# Patient Record
Sex: Female | Born: 1955
Health system: Southern US, Community
[De-identification: ages and names within clinical notes are randomized; demographics above are authoritative.]

## PROBLEM LIST (undated history)

## (undated) DIAGNOSIS — J45909 Unspecified asthma, uncomplicated: Secondary | ICD-10-CM

---

## 1994-08-31 HISTORY — PX: BREAST BIOPSY: SHX20

## 1994-08-31 HISTORY — PX: BREAST EXCISIONAL BIOPSY: SUR124

## 2003-09-01 HISTORY — PX: BREAST BIOPSY: SHX20

## 2004-07-16 ENCOUNTER — Ambulatory Visit: Payer: Self-pay | Admitting: General Surgery

## 2005-01-30 ENCOUNTER — Ambulatory Visit: Payer: Self-pay | Admitting: Unknown Physician Specialty

## 2005-02-04 ENCOUNTER — Ambulatory Visit: Payer: Self-pay | Admitting: General Surgery

## 2006-01-29 ENCOUNTER — Ambulatory Visit: Payer: Self-pay | Admitting: General Surgery

## 2006-06-23 ENCOUNTER — Emergency Department: Payer: Self-pay | Admitting: Emergency Medicine

## 2007-02-09 ENCOUNTER — Ambulatory Visit: Payer: Self-pay | Admitting: Obstetrics and Gynecology

## 2007-08-25 ENCOUNTER — Inpatient Hospital Stay: Payer: Self-pay | Admitting: Unknown Physician Specialty

## 2007-08-25 ENCOUNTER — Other Ambulatory Visit: Payer: Self-pay

## 2008-02-14 ENCOUNTER — Ambulatory Visit: Payer: Self-pay | Admitting: Obstetrics and Gynecology

## 2009-02-14 ENCOUNTER — Ambulatory Visit: Payer: Self-pay | Admitting: Obstetrics and Gynecology

## 2010-03-13 ENCOUNTER — Ambulatory Visit: Payer: Self-pay | Admitting: Obstetrics and Gynecology

## 2010-07-01 ENCOUNTER — Ambulatory Visit: Payer: Self-pay | Admitting: Gynecologic Oncology

## 2010-07-08 LAB — PATHOLOGY REPORT

## 2010-07-31 ENCOUNTER — Ambulatory Visit: Payer: Self-pay | Admitting: Gynecologic Oncology

## 2010-08-31 ENCOUNTER — Ambulatory Visit: Payer: Self-pay | Admitting: Gynecologic Oncology

## 2010-11-04 ENCOUNTER — Ambulatory Visit: Payer: Self-pay | Admitting: Gynecologic Oncology

## 2010-11-30 ENCOUNTER — Ambulatory Visit: Payer: Self-pay | Admitting: Gynecologic Oncology

## 2011-03-10 ENCOUNTER — Ambulatory Visit: Payer: Self-pay | Admitting: Gynecologic Oncology

## 2011-04-01 ENCOUNTER — Ambulatory Visit: Payer: Self-pay | Admitting: Gynecologic Oncology

## 2011-05-19 ENCOUNTER — Ambulatory Visit: Payer: Self-pay | Admitting: Obstetrics and Gynecology

## 2011-07-14 ENCOUNTER — Ambulatory Visit: Payer: Self-pay | Admitting: Gynecologic Oncology

## 2011-08-01 ENCOUNTER — Ambulatory Visit: Payer: Self-pay | Admitting: Gynecologic Oncology

## 2011-09-10 ENCOUNTER — Ambulatory Visit: Payer: Self-pay | Admitting: Unknown Physician Specialty

## 2011-09-18 ENCOUNTER — Ambulatory Visit: Payer: Self-pay | Admitting: Unknown Physician Specialty

## 2011-09-21 LAB — PATHOLOGY REPORT

## 2011-11-17 ENCOUNTER — Ambulatory Visit: Payer: Self-pay | Admitting: Gynecologic Oncology

## 2011-11-30 ENCOUNTER — Ambulatory Visit: Payer: Self-pay | Admitting: Gynecologic Oncology

## 2012-03-15 ENCOUNTER — Ambulatory Visit: Payer: Self-pay | Admitting: Gynecologic Oncology

## 2012-03-31 ENCOUNTER — Ambulatory Visit: Payer: Self-pay | Admitting: Gynecologic Oncology

## 2012-05-20 ENCOUNTER — Ambulatory Visit: Payer: Self-pay | Admitting: Obstetrics and Gynecology

## 2012-08-31 ENCOUNTER — Ambulatory Visit: Payer: Self-pay | Admitting: Gynecologic Oncology

## 2012-10-01 ENCOUNTER — Ambulatory Visit: Payer: Self-pay | Admitting: Gynecologic Oncology

## 2012-10-27 ENCOUNTER — Ambulatory Visit: Payer: Self-pay | Admitting: Obstetrics and Gynecology

## 2012-10-27 LAB — URINALYSIS, COMPLETE
Glucose,UR: NEGATIVE mg/dL (ref 0–75)
Nitrite: NEGATIVE
Ph: 7 (ref 4.5–8.0)
Protein: NEGATIVE
Specific Gravity: 1.021 (ref 1.003–1.030)
Squamous Epithelial: 11
WBC UR: 1 /HPF (ref 0–5)

## 2012-10-27 LAB — HEMOGLOBIN: HGB: 10.9 g/dL — ABNORMAL LOW (ref 12.0–16.0)

## 2012-11-14 ENCOUNTER — Ambulatory Visit: Payer: Self-pay | Admitting: Obstetrics and Gynecology

## 2012-11-15 LAB — PATHOLOGY REPORT

## 2013-05-22 ENCOUNTER — Ambulatory Visit: Payer: Self-pay | Admitting: Obstetrics and Gynecology

## 2014-06-04 ENCOUNTER — Ambulatory Visit: Payer: Self-pay | Admitting: Obstetrics and Gynecology

## 2014-12-21 NOTE — Op Note (Signed)
PATIENT NAME:  [name], Autumn MR#:  [id number] DATE OF BIRTH:  October 26, 1955  DATE OF PROCEDURE:  11/14/2012  PREOPERATIVE DIAGNOSES: Postmenopausal bleeding with thickened endometrium, cervical stenosis.   POSTOPERATIVE DIAGNOSES: Postmenopausal bleeding with thickened endometrium, cervical stenosis.   PROCEDURE: Dilation and curettage, hysteroscopy.   SURGEON: Ricky L. Amalia Hailey, M.D.   ANESTHESIA: General orotracheal.   FINDINGS: Tight stenotic cervix. Small pink mucoid polypoid mass in the upper uterus. Otherwise, markedly thin appearing endometrium.   ESTIMATED BLOOD LOSS: Minimal.   COMPLICATIONS: None.   SPECIMENS: Endometrial curettings.   DRAINS: In and out catheter with red rubber at the end of the case, approximately 35 mL of urine.   PROCEDURE IN DETAIL: The patient is on combination hormone patch, was having some intermittent spotting. Ultrasound showed a 6 mm endometrial stripe and unable to get through the cervix in the office. After discussing options recommended D and C, hysteroscopy. The patient consented. She was taken to the operating room and placed in the supine position where anesthesia was initiated, then placed in the dorsal lithotomy position using Allen stirrups, and prepped and draped in the usual sterile fashion. The cervix was visualized and grasped with a single-tooth tenaculum on the anterior lip. Unable to penetrate cervix. Grasped again with the posterior lip. While holding traction on both was able to enter through the stenotic cervix and was then easily dilated up to admit the hysteroscope.   Hysteroscopy was carried out with findings as noted above. A small filmy mucoid-appearing polypoid mass of the uterine fundus was visualized. Hysteroscope was removed. Polyp forceps was used to remove this area in entirety and was ensured with hysteroscopic evaluation. General global sampling was then carried out with sharpen curette. At this point, the procedure was felt to  achieve maximum efficacy. Instruments were removed. Areas from single-tooth tenaculum was made hemostatic with silver nitrate and the patient was returned to the supine position and left in the care of anesthesia.   The patient tolerated the procedure well. I anticipate a routine postoperative course. I anticipate benign findings on pathology. ____________________________ Rockey Situ. Amalia Hailey, MD rle:sb D: 11/14/2012 08:03:51 ET T: 11/14/2012 09:34:45 ET JOB#: [id number]  cc: Ricky L. Amalia Hailey, MD, <Dictator> Selmer Dominion MD ELECTRONICALLY SIGNED 11/14/2012 14:42

## 2015-05-13 ENCOUNTER — Other Ambulatory Visit: Payer: Self-pay | Admitting: Obstetrics and Gynecology

## 2015-05-13 DIAGNOSIS — Z1231 Encounter for screening mammogram for malignant neoplasm of breast: Secondary | ICD-10-CM

## 2015-06-07 ENCOUNTER — Ambulatory Visit
Admission: RE | Admit: 2015-06-07 | Discharge: 2015-06-07 | Disposition: A | Discharge status: Home / Self Care | Payer: BLUE CROSS/BLUE SHIELD | Source: Ambulatory Visit | Attending: Obstetrics and Gynecology | Admitting: Obstetrics and Gynecology

## 2015-06-07 DIAGNOSIS — Z1231 Encounter for screening mammogram for malignant neoplasm of breast: Secondary | ICD-10-CM | POA: Insufficient documentation

## 2016-05-12 ENCOUNTER — Other Ambulatory Visit: Payer: Self-pay | Admitting: Obstetrics and Gynecology

## 2016-05-12 DIAGNOSIS — Z1231 Encounter for screening mammogram for malignant neoplasm of breast: Secondary | ICD-10-CM

## 2016-06-11 ENCOUNTER — Ambulatory Visit
Admission: RE | Admit: 2016-06-11 | Discharge: 2016-06-11 | Disposition: A | Discharge status: Home / Self Care | Payer: BLUE CROSS/BLUE SHIELD | Source: Ambulatory Visit | Attending: Obstetrics and Gynecology | Admitting: Obstetrics and Gynecology

## 2016-06-11 DIAGNOSIS — Z1231 Encounter for screening mammogram for malignant neoplasm of breast: Secondary | ICD-10-CM | POA: Insufficient documentation

## 2016-11-03 ENCOUNTER — Ambulatory Visit
Admission: RE | Admit: 2016-11-03 | Discharge: 2016-11-03 | Disposition: A | Discharge status: Home / Self Care | Payer: BLUE CROSS/BLUE SHIELD | Source: Ambulatory Visit | Attending: Physician Assistant | Admitting: Physician Assistant

## 2016-11-03 ENCOUNTER — Other Ambulatory Visit: Payer: Self-pay | Admitting: Physician Assistant

## 2016-11-03 DIAGNOSIS — R0602 Shortness of breath: Secondary | ICD-10-CM | POA: Diagnosis present

## 2016-11-03 DIAGNOSIS — M549 Dorsalgia, unspecified: Secondary | ICD-10-CM

## 2016-11-03 DIAGNOSIS — K769 Liver disease, unspecified: Secondary | ICD-10-CM | POA: Diagnosis not present

## 2016-11-03 DIAGNOSIS — R938 Abnormal findings on diagnostic imaging of other specified body structures: Secondary | ICD-10-CM | POA: Diagnosis not present

## 2016-11-03 HISTORY — DX: Unspecified asthma, uncomplicated: J45.909

## 2016-11-03 LAB — POCT I-STAT CREATININE: Creatinine, Ser: 0.6 mg/dL (ref 0.44–1.00)

## 2016-11-03 MED ORDER — IOPAMIDOL (ISOVUE-370) INJECTION 76%
75.0000 mL | Freq: Once | INTRAVENOUS | Status: AC | PRN
  Administered 2016-11-03: 75 mL via INTRAVENOUS

## 2016-11-12 ENCOUNTER — Ambulatory Visit: Admit: 2016-11-12 | Payer: BLUE CROSS/BLUE SHIELD | Admitting: Unknown Physician Specialty

## 2016-11-12 SURGERY — COLONOSCOPY WITH PROPOFOL
Anesthesia: General

## 2017-02-15 ENCOUNTER — Other Ambulatory Visit: Payer: Self-pay | Admitting: Internal Medicine

## 2017-02-15 DIAGNOSIS — R222 Localized swelling, mass and lump, trunk: Secondary | ICD-10-CM

## 2017-02-26 ENCOUNTER — Ambulatory Visit
Admission: RE | Admit: 2017-02-26 | Discharge: 2017-02-26 | Disposition: A | Discharge status: Home / Self Care | Payer: BLUE CROSS/BLUE SHIELD | Source: Ambulatory Visit | Attending: Internal Medicine | Admitting: Internal Medicine

## 2017-02-26 DIAGNOSIS — I2584 Coronary atherosclerosis due to calcified coronary lesion: Secondary | ICD-10-CM | POA: Insufficient documentation

## 2017-02-26 DIAGNOSIS — R222 Localized swelling, mass and lump, trunk: Secondary | ICD-10-CM | POA: Insufficient documentation

## 2017-02-26 DIAGNOSIS — I2510 Atherosclerotic heart disease of native coronary artery without angina pectoris: Secondary | ICD-10-CM | POA: Insufficient documentation

## 2017-02-26 DIAGNOSIS — I700 Atherosclerosis of aorta: Secondary | ICD-10-CM | POA: Diagnosis not present

## 2017-02-26 DIAGNOSIS — K760 Fatty (change of) liver, not elsewhere classified: Secondary | ICD-10-CM | POA: Diagnosis not present

## 2017-02-26 MED ORDER — IOPAMIDOL (ISOVUE-300) INJECTION 61%
75.0000 mL | Freq: Once | INTRAVENOUS | Status: AC | PRN
  Administered 2017-02-26: 75 mL via INTRAVENOUS

## 2017-06-10 ENCOUNTER — Other Ambulatory Visit: Payer: Self-pay | Admitting: Obstetrics and Gynecology

## 2017-06-10 DIAGNOSIS — Z1231 Encounter for screening mammogram for malignant neoplasm of breast: Secondary | ICD-10-CM

## 2017-06-25 ENCOUNTER — Ambulatory Visit
Admission: RE | Admit: 2017-06-25 | Discharge: 2017-06-25 | Disposition: A | Discharge status: Home / Self Care | Payer: BLUE CROSS/BLUE SHIELD | Source: Ambulatory Visit | Attending: Obstetrics and Gynecology | Admitting: Obstetrics and Gynecology

## 2017-06-25 DIAGNOSIS — Z1231 Encounter for screening mammogram for malignant neoplasm of breast: Secondary | ICD-10-CM | POA: Diagnosis present

## 2018-05-18 ENCOUNTER — Other Ambulatory Visit: Payer: Self-pay | Admitting: Obstetrics and Gynecology

## 2018-05-18 DIAGNOSIS — Z1231 Encounter for screening mammogram for malignant neoplasm of breast: Secondary | ICD-10-CM

## 2018-06-29 ENCOUNTER — Ambulatory Visit
Admission: RE | Admit: 2018-06-29 | Discharge: 2018-06-29 | Disposition: A | Discharge status: Home / Self Care | Payer: BLUE CROSS/BLUE SHIELD | Source: Ambulatory Visit | Attending: Obstetrics and Gynecology | Admitting: Obstetrics and Gynecology

## 2018-06-29 DIAGNOSIS — Z1231 Encounter for screening mammogram for malignant neoplasm of breast: Secondary | ICD-10-CM | POA: Diagnosis present

## 2018-12-22 DIAGNOSIS — I2510 Atherosclerotic heart disease of native coronary artery without angina pectoris: Secondary | ICD-10-CM | POA: Diagnosis not present

## 2018-12-22 DIAGNOSIS — E7800 Pure hypercholesterolemia, unspecified: Secondary | ICD-10-CM | POA: Diagnosis not present

## 2018-12-29 DIAGNOSIS — E7800 Pure hypercholesterolemia, unspecified: Secondary | ICD-10-CM | POA: Diagnosis not present

## 2018-12-29 DIAGNOSIS — I700 Atherosclerosis of aorta: Secondary | ICD-10-CM | POA: Diagnosis not present

## 2018-12-29 DIAGNOSIS — I2510 Atherosclerotic heart disease of native coronary artery without angina pectoris: Secondary | ICD-10-CM | POA: Diagnosis not present

## 2018-12-29 DIAGNOSIS — J449 Chronic obstructive pulmonary disease, unspecified: Secondary | ICD-10-CM | POA: Diagnosis not present

## 2019-01-31 DIAGNOSIS — Z8619 Personal history of other infectious and parasitic diseases: Secondary | ICD-10-CM | POA: Diagnosis not present

## 2019-01-31 DIAGNOSIS — Z8742 Personal history of other diseases of the female genital tract: Secondary | ICD-10-CM | POA: Diagnosis not present

## 2019-01-31 DIAGNOSIS — N891 Moderate vaginal dysplasia: Secondary | ICD-10-CM | POA: Diagnosis not present

## 2019-02-13 DIAGNOSIS — J449 Chronic obstructive pulmonary disease, unspecified: Secondary | ICD-10-CM | POA: Diagnosis not present

## 2019-04-18 DIAGNOSIS — D692 Other nonthrombocytopenic purpura: Secondary | ICD-10-CM | POA: Diagnosis not present

## 2019-04-18 DIAGNOSIS — L905 Scar conditions and fibrosis of skin: Secondary | ICD-10-CM | POA: Diagnosis not present

## 2019-04-18 DIAGNOSIS — D225 Melanocytic nevi of trunk: Secondary | ICD-10-CM | POA: Diagnosis not present

## 2019-04-18 DIAGNOSIS — Z1283 Encounter for screening for malignant neoplasm of skin: Secondary | ICD-10-CM | POA: Diagnosis not present

## 2019-04-18 DIAGNOSIS — D2261 Melanocytic nevi of right upper limb, including shoulder: Secondary | ICD-10-CM | POA: Diagnosis not present

## 2019-04-18 DIAGNOSIS — L814 Other melanin hyperpigmentation: Secondary | ICD-10-CM | POA: Diagnosis not present

## 2019-04-18 DIAGNOSIS — D2262 Melanocytic nevi of left upper limb, including shoulder: Secondary | ICD-10-CM | POA: Diagnosis not present

## 2019-04-18 DIAGNOSIS — Z86018 Personal history of other benign neoplasm: Secondary | ICD-10-CM | POA: Diagnosis not present

## 2019-04-18 DIAGNOSIS — D1800 Hemangioma unspecified site: Secondary | ICD-10-CM | POA: Diagnosis not present

## 2019-05-25 ENCOUNTER — Other Ambulatory Visit: Payer: Self-pay | Admitting: Obstetrics and Gynecology

## 2019-05-25 ENCOUNTER — Other Ambulatory Visit: Payer: Self-pay | Admitting: Internal Medicine

## 2019-05-25 DIAGNOSIS — Z1231 Encounter for screening mammogram for malignant neoplasm of breast: Secondary | ICD-10-CM

## 2019-05-30 DIAGNOSIS — J069 Acute upper respiratory infection, unspecified: Secondary | ICD-10-CM | POA: Diagnosis not present

## 2019-05-30 DIAGNOSIS — J449 Chronic obstructive pulmonary disease, unspecified: Secondary | ICD-10-CM | POA: Diagnosis not present

## 2019-06-16 IMAGING — MG MM DIGITAL SCREENING BILAT W/ TOMO W/ CAD
8 series · 8 of 24 positions shown · non-contrast
Comparison: Previous exam(s).

CLINICAL DATA: Screening.

EXAM:
DIGITAL SCREENING BILATERAL MAMMOGRAM WITH TOMO AND CAD

[R CC synth-2D]
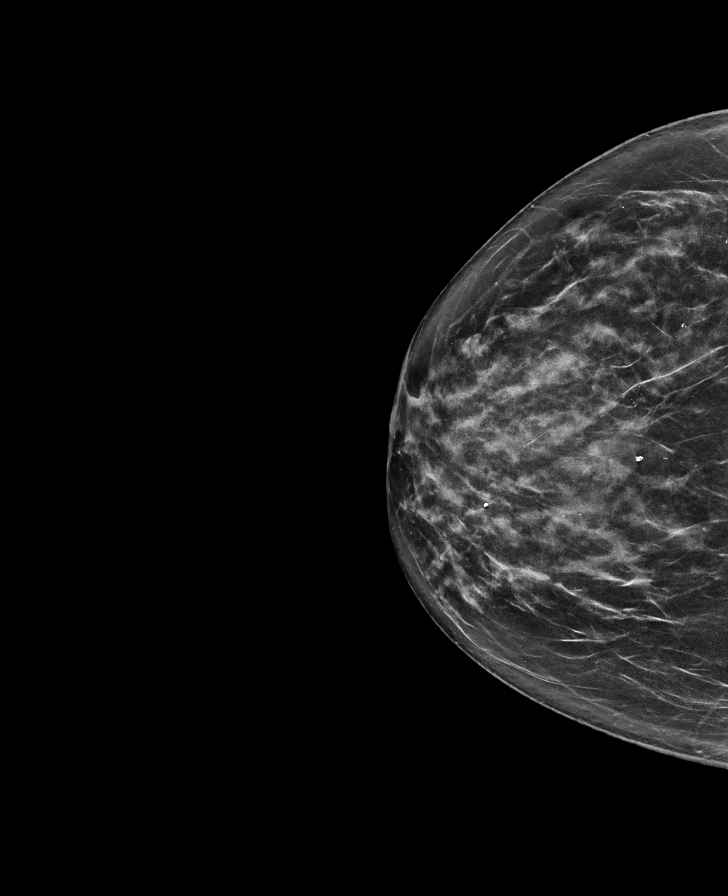

[L MLO synth-2D]
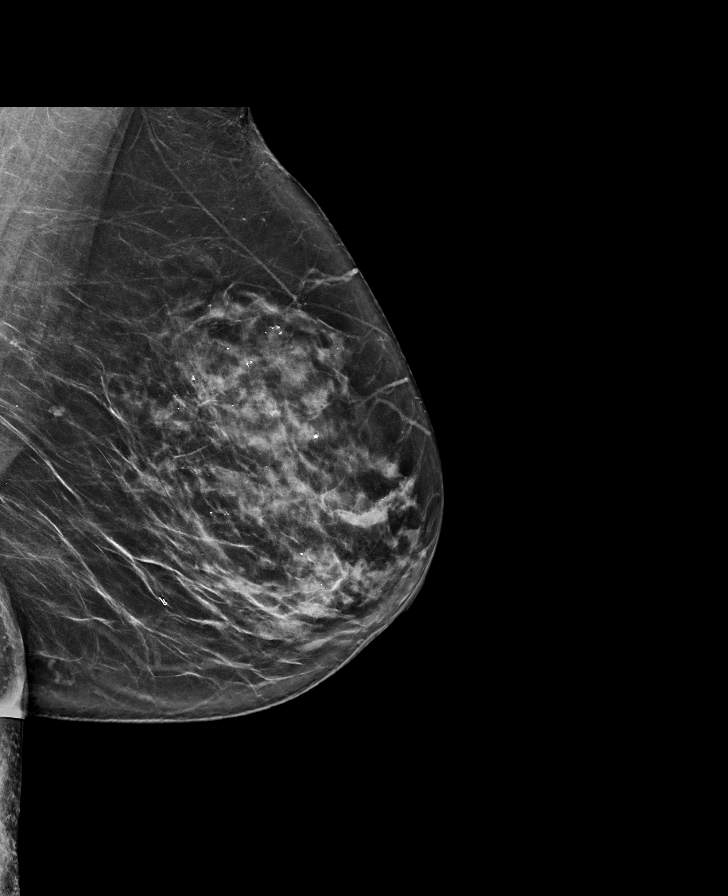

[L CC synth-2D]
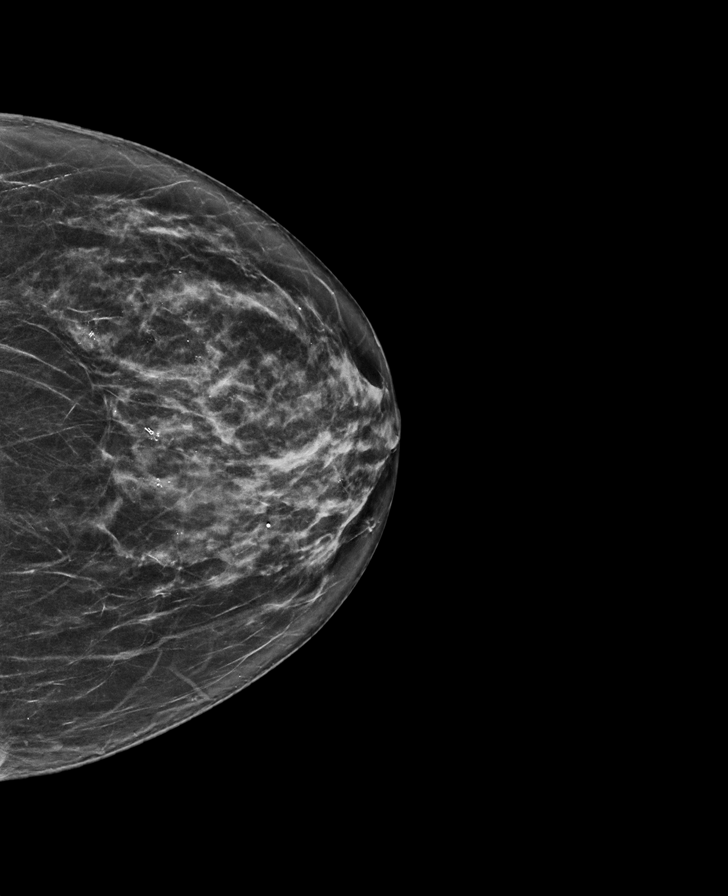

[R MLO synth-2D]
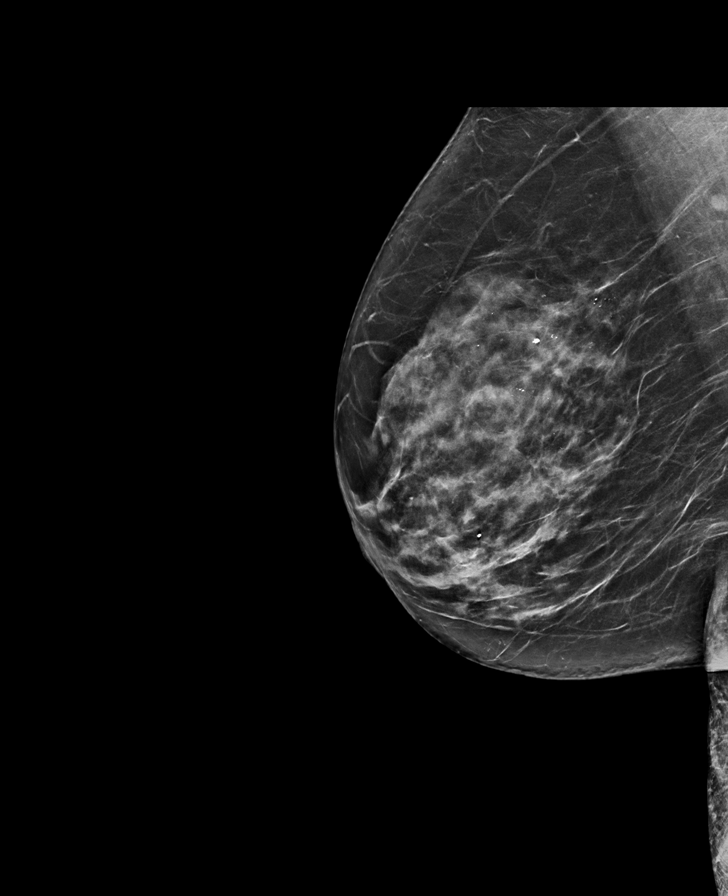

[R MLO tomo · tomo slice 38/75.0]
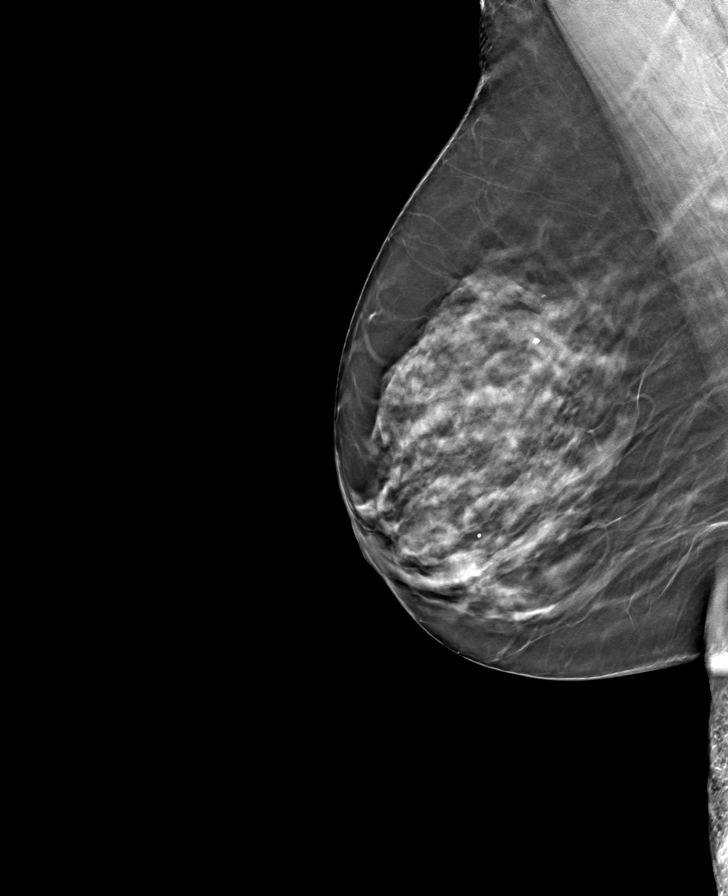

[L CC tomo · tomo slice 33/64.0]
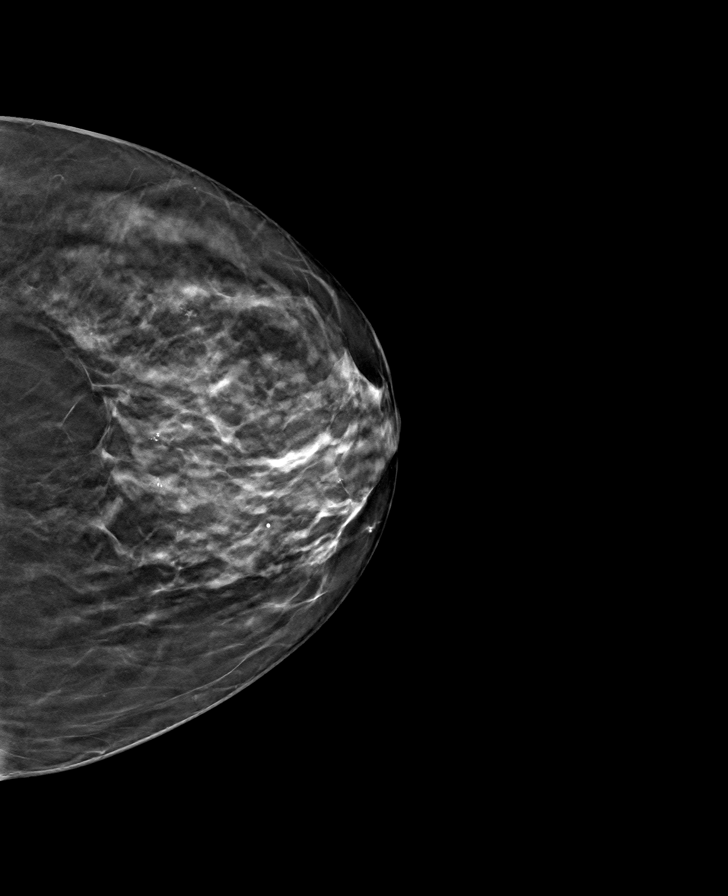

[R CC tomo · tomo slice 33/66.0]
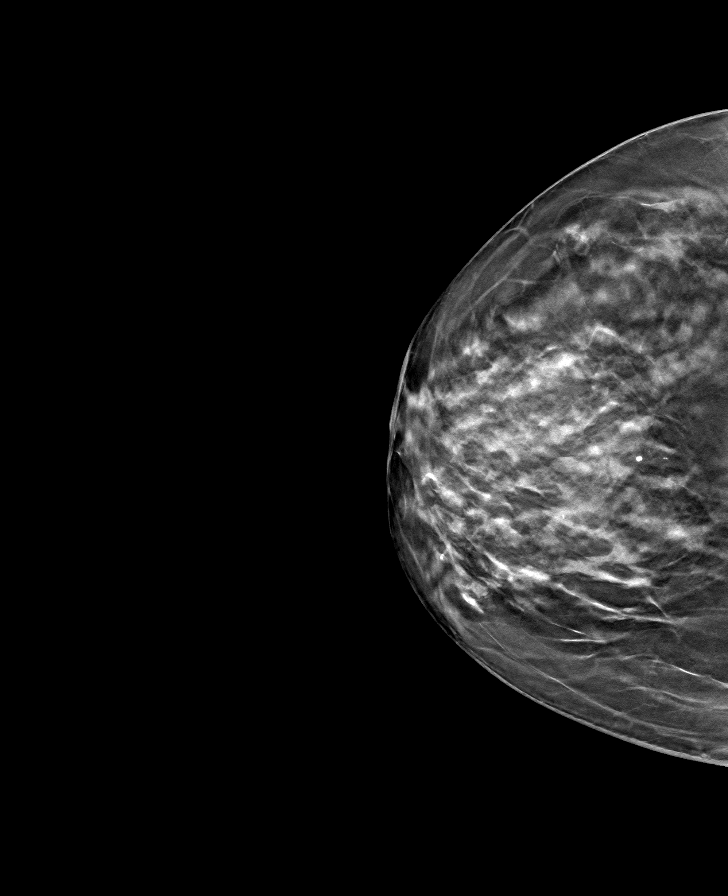

[L MLO tomo · tomo slice 37/74.0]
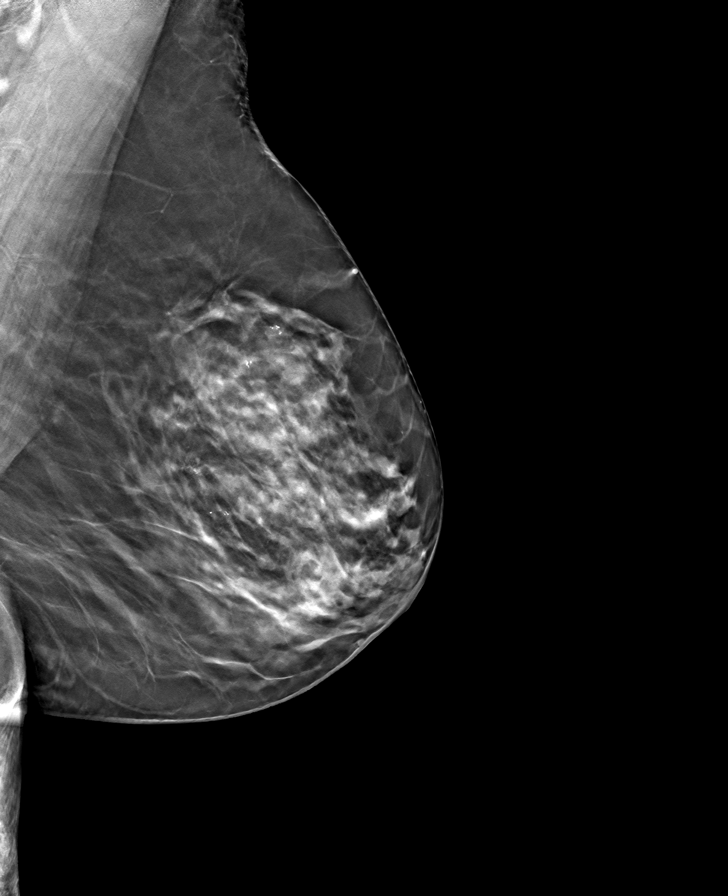

[8 of 24 positions shown; findings below may reference images not displayed]

ACR Breast Density Category c: The breast tissue is heterogeneously
dense, which may obscure small masses.
FINDINGS: There are no findings suspicious for malignancy. Images were
processed with CAD.
IMPRESSION: No mammographic evidence of malignancy. A result letter of this
screening mammogram will be mailed directly to the patient.

RECOMMENDATION:
Screening mammogram in one year. (Code:FT-[id number])

BI-RADS CATEGORY  1: Negative.

## 2019-06-28 DIAGNOSIS — Z23 Encounter for immunization: Secondary | ICD-10-CM | POA: Diagnosis not present

## 2019-07-05 ENCOUNTER — Other Ambulatory Visit: Payer: Self-pay

## 2019-07-05 ENCOUNTER — Ambulatory Visit
Admission: RE | Admit: 2019-07-05 | Discharge: 2019-07-05 | Disposition: A | Discharge status: Home / Self Care | Payer: Medicare HMO | Source: Ambulatory Visit | Attending: Internal Medicine | Admitting: Internal Medicine

## 2019-07-05 DIAGNOSIS — Z1231 Encounter for screening mammogram for malignant neoplasm of breast: Secondary | ICD-10-CM

## 2019-07-19 DIAGNOSIS — K635 Polyp of colon: Secondary | ICD-10-CM | POA: Diagnosis not present

## 2019-07-19 DIAGNOSIS — I700 Atherosclerosis of aorta: Secondary | ICD-10-CM | POA: Diagnosis not present

## 2019-07-19 DIAGNOSIS — E7800 Pure hypercholesterolemia, unspecified: Secondary | ICD-10-CM | POA: Diagnosis not present

## 2019-07-19 DIAGNOSIS — K760 Fatty (change of) liver, not elsewhere classified: Secondary | ICD-10-CM | POA: Diagnosis not present

## 2019-07-19 DIAGNOSIS — I2510 Atherosclerotic heart disease of native coronary artery without angina pectoris: Secondary | ICD-10-CM | POA: Diagnosis not present

## 2019-07-26 DIAGNOSIS — I2510 Atherosclerotic heart disease of native coronary artery without angina pectoris: Secondary | ICD-10-CM | POA: Diagnosis not present

## 2019-07-26 DIAGNOSIS — J449 Chronic obstructive pulmonary disease, unspecified: Secondary | ICD-10-CM | POA: Diagnosis not present

## 2019-07-26 DIAGNOSIS — Z7289 Other problems related to lifestyle: Secondary | ICD-10-CM | POA: Diagnosis not present

## 2019-07-26 DIAGNOSIS — Z0000 Encounter for general adult medical examination without abnormal findings: Secondary | ICD-10-CM | POA: Diagnosis not present

## 2019-07-26 DIAGNOSIS — I700 Atherosclerosis of aorta: Secondary | ICD-10-CM | POA: Diagnosis not present

## 2019-07-31 DIAGNOSIS — Z23 Encounter for immunization: Secondary | ICD-10-CM | POA: Diagnosis not present

## 2019-08-03 DIAGNOSIS — N891 Moderate vaginal dysplasia: Secondary | ICD-10-CM | POA: Diagnosis not present

## 2019-08-03 DIAGNOSIS — Z8619 Personal history of other infectious and parasitic diseases: Secondary | ICD-10-CM | POA: Diagnosis not present

## 2019-08-03 DIAGNOSIS — Z8742 Personal history of other diseases of the female genital tract: Secondary | ICD-10-CM | POA: Diagnosis not present

## 2019-08-18 DIAGNOSIS — I2510 Atherosclerotic heart disease of native coronary artery without angina pectoris: Secondary | ICD-10-CM | POA: Diagnosis not present

## 2019-09-11 DIAGNOSIS — J310 Chronic rhinitis: Secondary | ICD-10-CM | POA: Diagnosis not present

## 2019-09-11 DIAGNOSIS — J449 Chronic obstructive pulmonary disease, unspecified: Secondary | ICD-10-CM | POA: Diagnosis not present

## 2019-11-14 DIAGNOSIS — H5213 Myopia, bilateral: Secondary | ICD-10-CM | POA: Diagnosis not present

## 2020-01-23 DIAGNOSIS — I2510 Atherosclerotic heart disease of native coronary artery without angina pectoris: Secondary | ICD-10-CM | POA: Diagnosis not present

## 2020-01-23 DIAGNOSIS — I700 Atherosclerosis of aorta: Secondary | ICD-10-CM | POA: Diagnosis not present

## 2020-01-23 DIAGNOSIS — J449 Chronic obstructive pulmonary disease, unspecified: Secondary | ICD-10-CM | POA: Diagnosis not present

## 2020-01-23 DIAGNOSIS — E7800 Pure hypercholesterolemia, unspecified: Secondary | ICD-10-CM | POA: Diagnosis not present

## 2020-03-07 DIAGNOSIS — Z124 Encounter for screening for malignant neoplasm of cervix: Secondary | ICD-10-CM | POA: Diagnosis not present

## 2020-03-07 DIAGNOSIS — B977 Papillomavirus as the cause of diseases classified elsewhere: Secondary | ICD-10-CM | POA: Diagnosis not present

## 2020-03-07 DIAGNOSIS — Z8742 Personal history of other diseases of the female genital tract: Secondary | ICD-10-CM | POA: Diagnosis not present

## 2020-03-07 DIAGNOSIS — N891 Moderate vaginal dysplasia: Secondary | ICD-10-CM | POA: Diagnosis not present

## 2020-04-10 DIAGNOSIS — Z01818 Encounter for other preprocedural examination: Secondary | ICD-10-CM | POA: Diagnosis not present

## 2020-04-10 DIAGNOSIS — R0600 Dyspnea, unspecified: Secondary | ICD-10-CM | POA: Diagnosis not present

## 2020-04-12 DIAGNOSIS — J310 Chronic rhinitis: Secondary | ICD-10-CM | POA: Diagnosis not present

## 2020-04-12 DIAGNOSIS — J90 Pleural effusion, not elsewhere classified: Secondary | ICD-10-CM | POA: Diagnosis not present

## 2020-04-12 DIAGNOSIS — I517 Cardiomegaly: Secondary | ICD-10-CM | POA: Diagnosis not present

## 2020-04-12 DIAGNOSIS — J449 Chronic obstructive pulmonary disease, unspecified: Secondary | ICD-10-CM | POA: Diagnosis not present

## 2020-04-12 DIAGNOSIS — R0602 Shortness of breath: Secondary | ICD-10-CM | POA: Diagnosis not present

## 2020-07-03 ENCOUNTER — Other Ambulatory Visit: Payer: Self-pay | Admitting: Obstetrics and Gynecology

## 2020-07-03 DIAGNOSIS — Z1231 Encounter for screening mammogram for malignant neoplasm of breast: Secondary | ICD-10-CM

## 2020-07-10 DIAGNOSIS — Z23 Encounter for immunization: Secondary | ICD-10-CM | POA: Diagnosis not present

## 2020-07-15 DIAGNOSIS — J069 Acute upper respiratory infection, unspecified: Secondary | ICD-10-CM | POA: Diagnosis not present

## 2020-07-15 DIAGNOSIS — M7732 Calcaneal spur, left foot: Secondary | ICD-10-CM | POA: Diagnosis not present

## 2020-07-15 DIAGNOSIS — M25562 Pain in left knee: Secondary | ICD-10-CM | POA: Diagnosis not present

## 2020-07-15 DIAGNOSIS — M19072 Primary osteoarthritis, left ankle and foot: Secondary | ICD-10-CM | POA: Diagnosis not present

## 2020-07-15 DIAGNOSIS — M79672 Pain in left foot: Secondary | ICD-10-CM | POA: Diagnosis not present

## 2020-07-22 DIAGNOSIS — E7800 Pure hypercholesterolemia, unspecified: Secondary | ICD-10-CM | POA: Diagnosis not present

## 2020-07-22 DIAGNOSIS — I2510 Atherosclerotic heart disease of native coronary artery without angina pectoris: Secondary | ICD-10-CM | POA: Diagnosis not present

## 2020-07-22 DIAGNOSIS — J449 Chronic obstructive pulmonary disease, unspecified: Secondary | ICD-10-CM | POA: Diagnosis not present

## 2020-08-01 DIAGNOSIS — I2510 Atherosclerotic heart disease of native coronary artery without angina pectoris: Secondary | ICD-10-CM | POA: Diagnosis not present

## 2020-08-01 DIAGNOSIS — J449 Chronic obstructive pulmonary disease, unspecified: Secondary | ICD-10-CM | POA: Diagnosis not present

## 2020-08-01 DIAGNOSIS — I700 Atherosclerosis of aorta: Secondary | ICD-10-CM | POA: Diagnosis not present

## 2020-08-01 DIAGNOSIS — Z0000 Encounter for general adult medical examination without abnormal findings: Secondary | ICD-10-CM | POA: Diagnosis not present

## 2020-08-14 ENCOUNTER — Other Ambulatory Visit: Payer: Self-pay

## 2020-08-14 ENCOUNTER — Ambulatory Visit
Admission: RE | Admit: 2020-08-14 | Discharge: 2020-08-14 | Disposition: A | Discharge status: Home / Self Care | Payer: Medicare HMO | Source: Ambulatory Visit | Attending: Obstetrics and Gynecology | Admitting: Obstetrics and Gynecology

## 2020-08-14 DIAGNOSIS — Z1231 Encounter for screening mammogram for malignant neoplasm of breast: Secondary | ICD-10-CM

## 2020-12-18 DIAGNOSIS — D2262 Melanocytic nevi of left upper limb, including shoulder: Secondary | ICD-10-CM | POA: Diagnosis not present

## 2020-12-18 DIAGNOSIS — D2261 Melanocytic nevi of right upper limb, including shoulder: Secondary | ICD-10-CM | POA: Diagnosis not present

## 2020-12-18 DIAGNOSIS — L821 Other seborrheic keratosis: Secondary | ICD-10-CM | POA: Diagnosis not present

## 2020-12-18 DIAGNOSIS — D225 Melanocytic nevi of trunk: Secondary | ICD-10-CM | POA: Diagnosis not present

## 2020-12-18 DIAGNOSIS — D2271 Melanocytic nevi of right lower limb, including hip: Secondary | ICD-10-CM | POA: Diagnosis not present

## 2020-12-18 DIAGNOSIS — L538 Other specified erythematous conditions: Secondary | ICD-10-CM | POA: Diagnosis not present

## 2020-12-18 DIAGNOSIS — D2272 Melanocytic nevi of left lower limb, including hip: Secondary | ICD-10-CM | POA: Diagnosis not present

## 2020-12-18 DIAGNOSIS — L820 Inflamed seborrheic keratosis: Secondary | ICD-10-CM | POA: Diagnosis not present

## 2021-01-29 DIAGNOSIS — J449 Chronic obstructive pulmonary disease, unspecified: Secondary | ICD-10-CM | POA: Diagnosis not present

## 2021-01-29 DIAGNOSIS — I2510 Atherosclerotic heart disease of native coronary artery without angina pectoris: Secondary | ICD-10-CM | POA: Diagnosis not present

## 2021-02-03 DIAGNOSIS — M79672 Pain in left foot: Secondary | ICD-10-CM | POA: Diagnosis not present

## 2021-02-03 DIAGNOSIS — Z0100 Encounter for examination of eyes and vision without abnormal findings: Secondary | ICD-10-CM | POA: Diagnosis not present

## 2021-02-03 DIAGNOSIS — E7800 Pure hypercholesterolemia, unspecified: Secondary | ICD-10-CM | POA: Diagnosis not present

## 2021-02-03 DIAGNOSIS — I700 Atherosclerosis of aorta: Secondary | ICD-10-CM | POA: Diagnosis not present

## 2021-02-03 DIAGNOSIS — I2510 Atherosclerotic heart disease of native coronary artery without angina pectoris: Secondary | ICD-10-CM | POA: Diagnosis not present

## 2021-02-03 DIAGNOSIS — J449 Chronic obstructive pulmonary disease, unspecified: Secondary | ICD-10-CM | POA: Diagnosis not present

## 2021-02-03 DIAGNOSIS — H5213 Myopia, bilateral: Secondary | ICD-10-CM | POA: Diagnosis not present

## 2021-02-03 DIAGNOSIS — M25562 Pain in left knee: Secondary | ICD-10-CM | POA: Diagnosis not present

## 2021-03-12 ENCOUNTER — Other Ambulatory Visit: Payer: Self-pay | Admitting: Obstetrics and Gynecology

## 2021-03-12 DIAGNOSIS — Z8742 Personal history of other diseases of the female genital tract: Secondary | ICD-10-CM | POA: Diagnosis not present

## 2021-03-12 DIAGNOSIS — Z1231 Encounter for screening mammogram for malignant neoplasm of breast: Secondary | ICD-10-CM

## 2021-03-12 DIAGNOSIS — Z1331 Encounter for screening for depression: Secondary | ICD-10-CM | POA: Diagnosis not present

## 2021-03-12 DIAGNOSIS — N891 Moderate vaginal dysplasia: Secondary | ICD-10-CM | POA: Diagnosis not present

## 2021-03-12 DIAGNOSIS — Z9189 Other specified personal risk factors, not elsewhere classified: Secondary | ICD-10-CM | POA: Diagnosis not present

## 2021-06-10 DIAGNOSIS — Z23 Encounter for immunization: Secondary | ICD-10-CM | POA: Diagnosis not present

## 2021-07-10 DIAGNOSIS — J310 Chronic rhinitis: Secondary | ICD-10-CM | POA: Diagnosis not present

## 2021-07-10 DIAGNOSIS — J449 Chronic obstructive pulmonary disease, unspecified: Secondary | ICD-10-CM | POA: Diagnosis not present

## 2021-08-04 DIAGNOSIS — I700 Atherosclerosis of aorta: Secondary | ICD-10-CM | POA: Diagnosis not present

## 2021-08-04 DIAGNOSIS — I2510 Atherosclerotic heart disease of native coronary artery without angina pectoris: Secondary | ICD-10-CM | POA: Diagnosis not present

## 2021-08-04 DIAGNOSIS — J449 Chronic obstructive pulmonary disease, unspecified: Secondary | ICD-10-CM | POA: Diagnosis not present

## 2021-08-11 DIAGNOSIS — J449 Chronic obstructive pulmonary disease, unspecified: Secondary | ICD-10-CM | POA: Diagnosis not present

## 2021-08-11 DIAGNOSIS — Z0000 Encounter for general adult medical examination without abnormal findings: Secondary | ICD-10-CM | POA: Diagnosis not present

## 2021-08-11 DIAGNOSIS — I2510 Atherosclerotic heart disease of native coronary artery without angina pectoris: Secondary | ICD-10-CM | POA: Diagnosis not present

## 2021-08-11 DIAGNOSIS — I700 Atherosclerosis of aorta: Secondary | ICD-10-CM | POA: Diagnosis not present

## 2021-08-15 ENCOUNTER — Ambulatory Visit
Admission: RE | Admit: 2021-08-15 | Discharge: 2021-08-15 | Disposition: A | Discharge status: Home / Self Care | Payer: Medicare HMO | Source: Ambulatory Visit | Attending: Obstetrics and Gynecology | Admitting: Obstetrics and Gynecology

## 2021-08-15 ENCOUNTER — Other Ambulatory Visit: Payer: Self-pay

## 2021-08-15 DIAGNOSIS — Z1231 Encounter for screening mammogram for malignant neoplasm of breast: Secondary | ICD-10-CM | POA: Diagnosis not present

## 2021-12-18 DIAGNOSIS — D2272 Melanocytic nevi of left lower limb, including hip: Secondary | ICD-10-CM | POA: Diagnosis not present

## 2021-12-18 DIAGNOSIS — L570 Actinic keratosis: Secondary | ICD-10-CM | POA: Diagnosis not present

## 2021-12-18 DIAGNOSIS — D2271 Melanocytic nevi of right lower limb, including hip: Secondary | ICD-10-CM | POA: Diagnosis not present

## 2021-12-18 DIAGNOSIS — D2261 Melanocytic nevi of right upper limb, including shoulder: Secondary | ICD-10-CM | POA: Diagnosis not present

## 2021-12-18 DIAGNOSIS — L821 Other seborrheic keratosis: Secondary | ICD-10-CM | POA: Diagnosis not present

## 2021-12-18 DIAGNOSIS — X32XXXA Exposure to sunlight, initial encounter: Secondary | ICD-10-CM | POA: Diagnosis not present

## 2021-12-18 DIAGNOSIS — D2262 Melanocytic nevi of left upper limb, including shoulder: Secondary | ICD-10-CM | POA: Diagnosis not present

## 2021-12-18 DIAGNOSIS — D225 Melanocytic nevi of trunk: Secondary | ICD-10-CM | POA: Diagnosis not present

## 2022-02-12 DIAGNOSIS — I2510 Atherosclerotic heart disease of native coronary artery without angina pectoris: Secondary | ICD-10-CM | POA: Diagnosis not present

## 2022-02-12 DIAGNOSIS — J449 Chronic obstructive pulmonary disease, unspecified: Secondary | ICD-10-CM | POA: Diagnosis not present

## 2022-02-16 DIAGNOSIS — K219 Gastro-esophageal reflux disease without esophagitis: Secondary | ICD-10-CM | POA: Diagnosis not present

## 2022-02-16 DIAGNOSIS — J449 Chronic obstructive pulmonary disease, unspecified: Secondary | ICD-10-CM | POA: Diagnosis not present

## 2022-02-19 DIAGNOSIS — I2510 Atherosclerotic heart disease of native coronary artery without angina pectoris: Secondary | ICD-10-CM | POA: Diagnosis not present

## 2022-02-19 DIAGNOSIS — E7800 Pure hypercholesterolemia, unspecified: Secondary | ICD-10-CM | POA: Diagnosis not present

## 2022-02-19 DIAGNOSIS — J449 Chronic obstructive pulmonary disease, unspecified: Secondary | ICD-10-CM | POA: Diagnosis not present

## 2022-02-19 DIAGNOSIS — I700 Atherosclerosis of aorta: Secondary | ICD-10-CM | POA: Diagnosis not present

## 2022-03-20 DIAGNOSIS — Z86010 Personal history of colonic polyps: Secondary | ICD-10-CM | POA: Diagnosis not present

## 2022-03-20 DIAGNOSIS — K644 Residual hemorrhoidal skin tags: Secondary | ICD-10-CM | POA: Diagnosis not present

## 2022-04-07 DIAGNOSIS — K642 Third degree hemorrhoids: Secondary | ICD-10-CM | POA: Diagnosis not present

## 2022-04-07 DIAGNOSIS — K649 Unspecified hemorrhoids: Secondary | ICD-10-CM | POA: Diagnosis not present

## 2022-04-07 DIAGNOSIS — K5730 Diverticulosis of large intestine without perforation or abscess without bleeding: Secondary | ICD-10-CM | POA: Diagnosis not present

## 2022-04-07 DIAGNOSIS — Z86010 Personal history of colonic polyps: Secondary | ICD-10-CM | POA: Diagnosis not present

## 2022-05-18 DIAGNOSIS — Z9189 Other specified personal risk factors, not elsewhere classified: Secondary | ICD-10-CM | POA: Diagnosis not present

## 2022-05-18 DIAGNOSIS — N891 Moderate vaginal dysplasia: Secondary | ICD-10-CM | POA: Diagnosis not present

## 2022-05-18 DIAGNOSIS — Z1231 Encounter for screening mammogram for malignant neoplasm of breast: Secondary | ICD-10-CM | POA: Diagnosis not present

## 2022-05-18 DIAGNOSIS — Z01419 Encounter for gynecological examination (general) (routine) without abnormal findings: Secondary | ICD-10-CM | POA: Diagnosis not present

## 2022-05-18 DIAGNOSIS — Z8742 Personal history of other diseases of the female genital tract: Secondary | ICD-10-CM | POA: Diagnosis not present

## 2022-05-20 ENCOUNTER — Other Ambulatory Visit: Payer: Self-pay | Admitting: Obstetrics and Gynecology

## 2022-05-20 DIAGNOSIS — Z1231 Encounter for screening mammogram for malignant neoplasm of breast: Secondary | ICD-10-CM

## 2022-06-25 DIAGNOSIS — Z23 Encounter for immunization: Secondary | ICD-10-CM | POA: Diagnosis not present

## 2022-08-11 DIAGNOSIS — I2510 Atherosclerotic heart disease of native coronary artery without angina pectoris: Secondary | ICD-10-CM | POA: Diagnosis not present

## 2022-08-11 DIAGNOSIS — E7800 Pure hypercholesterolemia, unspecified: Secondary | ICD-10-CM | POA: Diagnosis not present

## 2022-08-18 ENCOUNTER — Ambulatory Visit
Admission: RE | Admit: 2022-08-18 | Discharge: 2022-08-18 | Disposition: A | Discharge status: Home / Self Care | Payer: Medicare HMO | Source: Ambulatory Visit | Attending: Internal Medicine | Admitting: Internal Medicine

## 2022-08-18 ENCOUNTER — Other Ambulatory Visit: Payer: Self-pay | Admitting: Internal Medicine

## 2022-08-18 DIAGNOSIS — J441 Chronic obstructive pulmonary disease with (acute) exacerbation: Secondary | ICD-10-CM | POA: Diagnosis not present

## 2022-08-18 DIAGNOSIS — I2510 Atherosclerotic heart disease of native coronary artery without angina pectoris: Secondary | ICD-10-CM | POA: Diagnosis not present

## 2022-08-18 DIAGNOSIS — I700 Atherosclerosis of aorta: Secondary | ICD-10-CM | POA: Diagnosis not present

## 2022-08-18 DIAGNOSIS — Z0000 Encounter for general adult medical examination without abnormal findings: Secondary | ICD-10-CM | POA: Diagnosis not present

## 2022-08-18 DIAGNOSIS — R600 Localized edema: Secondary | ICD-10-CM | POA: Insufficient documentation

## 2022-08-18 DIAGNOSIS — E876 Hypokalemia: Secondary | ICD-10-CM | POA: Diagnosis not present

## 2022-08-18 DIAGNOSIS — J4489 Other specified chronic obstructive pulmonary disease: Secondary | ICD-10-CM | POA: Diagnosis not present

## 2022-08-18 DIAGNOSIS — Z1331 Encounter for screening for depression: Secondary | ICD-10-CM | POA: Diagnosis not present

## 2022-08-27 DIAGNOSIS — M1712 Unilateral primary osteoarthritis, left knee: Secondary | ICD-10-CM | POA: Diagnosis not present

## 2022-08-27 DIAGNOSIS — I700 Atherosclerosis of aorta: Secondary | ICD-10-CM | POA: Diagnosis not present

## 2022-08-27 DIAGNOSIS — M1710 Unilateral primary osteoarthritis, unspecified knee: Secondary | ICD-10-CM | POA: Diagnosis not present

## 2022-08-27 DIAGNOSIS — M25862 Other specified joint disorders, left knee: Secondary | ICD-10-CM | POA: Diagnosis not present

## 2022-09-14 DIAGNOSIS — M25862 Other specified joint disorders, left knee: Secondary | ICD-10-CM | POA: Diagnosis not present

## 2022-09-21 ENCOUNTER — Ambulatory Visit
Admission: RE | Admit: 2022-09-21 | Discharge: 2022-09-21 | Disposition: A | Discharge status: Home / Self Care | Payer: Medicare HMO | Source: Ambulatory Visit | Attending: Obstetrics and Gynecology | Admitting: Obstetrics and Gynecology

## 2022-09-21 DIAGNOSIS — Z1231 Encounter for screening mammogram for malignant neoplasm of breast: Secondary | ICD-10-CM | POA: Insufficient documentation

## 2022-11-11 DIAGNOSIS — M7122 Synovial cyst of popliteal space [Baker], left knee: Secondary | ICD-10-CM | POA: Diagnosis not present

## 2022-11-11 DIAGNOSIS — M25562 Pain in left knee: Secondary | ICD-10-CM | POA: Diagnosis not present

## 2022-11-11 DIAGNOSIS — M1712 Unilateral primary osteoarthritis, left knee: Secondary | ICD-10-CM | POA: Diagnosis not present

## 2022-11-11 DIAGNOSIS — M25462 Effusion, left knee: Secondary | ICD-10-CM | POA: Diagnosis not present

## 2022-11-11 DIAGNOSIS — G8929 Other chronic pain: Secondary | ICD-10-CM | POA: Diagnosis not present

## 2022-12-25 DIAGNOSIS — M25462 Effusion, left knee: Secondary | ICD-10-CM | POA: Diagnosis not present

## 2022-12-25 DIAGNOSIS — M1712 Unilateral primary osteoarthritis, left knee: Secondary | ICD-10-CM | POA: Diagnosis not present

## 2022-12-25 DIAGNOSIS — G8929 Other chronic pain: Secondary | ICD-10-CM | POA: Diagnosis not present

## 2022-12-25 DIAGNOSIS — M7122 Synovial cyst of popliteal space [Baker], left knee: Secondary | ICD-10-CM | POA: Diagnosis not present

## 2023-01-22 DIAGNOSIS — M25562 Pain in left knee: Secondary | ICD-10-CM | POA: Diagnosis not present

## 2023-01-22 DIAGNOSIS — M1712 Unilateral primary osteoarthritis, left knee: Secondary | ICD-10-CM | POA: Diagnosis not present

## 2023-01-22 DIAGNOSIS — M25462 Effusion, left knee: Secondary | ICD-10-CM | POA: Diagnosis not present

## 2023-01-22 DIAGNOSIS — G8929 Other chronic pain: Secondary | ICD-10-CM | POA: Diagnosis not present

## 2023-01-22 DIAGNOSIS — M7122 Synovial cyst of popliteal space [Baker], left knee: Secondary | ICD-10-CM | POA: Diagnosis not present

## 2023-02-17 DIAGNOSIS — I700 Atherosclerosis of aorta: Secondary | ICD-10-CM | POA: Diagnosis not present

## 2023-02-17 DIAGNOSIS — I2510 Atherosclerotic heart disease of native coronary artery without angina pectoris: Secondary | ICD-10-CM | POA: Diagnosis not present

## 2023-02-17 DIAGNOSIS — E876 Hypokalemia: Secondary | ICD-10-CM | POA: Diagnosis not present

## 2023-02-17 DIAGNOSIS — J4489 Other specified chronic obstructive pulmonary disease: Secondary | ICD-10-CM | POA: Diagnosis not present

## 2023-02-17 DIAGNOSIS — J449 Chronic obstructive pulmonary disease, unspecified: Secondary | ICD-10-CM | POA: Diagnosis not present

## 2023-03-26 DIAGNOSIS — G8929 Other chronic pain: Secondary | ICD-10-CM | POA: Diagnosis not present

## 2023-03-26 DIAGNOSIS — M7122 Synovial cyst of popliteal space [Baker], left knee: Secondary | ICD-10-CM | POA: Diagnosis not present

## 2023-03-26 DIAGNOSIS — M1712 Unilateral primary osteoarthritis, left knee: Secondary | ICD-10-CM | POA: Diagnosis not present

## 2023-03-26 DIAGNOSIS — M25462 Effusion, left knee: Secondary | ICD-10-CM | POA: Diagnosis not present

## 2023-03-26 DIAGNOSIS — M25562 Pain in left knee: Secondary | ICD-10-CM | POA: Diagnosis not present

## 2023-04-08 DIAGNOSIS — D2261 Melanocytic nevi of right upper limb, including shoulder: Secondary | ICD-10-CM | POA: Diagnosis not present

## 2023-04-08 DIAGNOSIS — D2262 Melanocytic nevi of left upper limb, including shoulder: Secondary | ICD-10-CM | POA: Diagnosis not present

## 2023-04-08 DIAGNOSIS — D225 Melanocytic nevi of trunk: Secondary | ICD-10-CM | POA: Diagnosis not present

## 2023-04-08 DIAGNOSIS — D2272 Melanocytic nevi of left lower limb, including hip: Secondary | ICD-10-CM | POA: Diagnosis not present

## 2023-04-08 DIAGNOSIS — D2271 Melanocytic nevi of right lower limb, including hip: Secondary | ICD-10-CM | POA: Diagnosis not present

## 2023-04-08 DIAGNOSIS — L821 Other seborrheic keratosis: Secondary | ICD-10-CM | POA: Diagnosis not present

## 2023-04-08 DIAGNOSIS — L570 Actinic keratosis: Secondary | ICD-10-CM | POA: Diagnosis not present

## 2023-04-08 DIAGNOSIS — X32XXXA Exposure to sunlight, initial encounter: Secondary | ICD-10-CM | POA: Diagnosis not present

## 2023-04-28 DIAGNOSIS — M1712 Unilateral primary osteoarthritis, left knee: Secondary | ICD-10-CM | POA: Diagnosis not present

## 2023-04-28 DIAGNOSIS — M1710 Unilateral primary osteoarthritis, unspecified knee: Secondary | ICD-10-CM | POA: Diagnosis not present

## 2023-05-11 DIAGNOSIS — H0288B Meibomian gland dysfunction left eye, upper and lower eyelids: Secondary | ICD-10-CM | POA: Diagnosis not present

## 2023-05-11 DIAGNOSIS — H02135 Senile ectropion of left lower eyelid: Secondary | ICD-10-CM | POA: Diagnosis not present

## 2023-05-11 DIAGNOSIS — H0288A Meibomian gland dysfunction right eye, upper and lower eyelids: Secondary | ICD-10-CM | POA: Diagnosis not present

## 2023-05-11 DIAGNOSIS — H47323 Drusen of optic disc, bilateral: Secondary | ICD-10-CM | POA: Diagnosis not present

## 2023-05-11 DIAGNOSIS — H02831 Dermatochalasis of right upper eyelid: Secondary | ICD-10-CM | POA: Diagnosis not present

## 2023-05-11 DIAGNOSIS — H02834 Dermatochalasis of left upper eyelid: Secondary | ICD-10-CM | POA: Diagnosis not present

## 2023-05-11 DIAGNOSIS — H02132 Senile ectropion of right lower eyelid: Secondary | ICD-10-CM | POA: Diagnosis not present

## 2023-05-11 DIAGNOSIS — D485 Neoplasm of uncertain behavior of skin: Secondary | ICD-10-CM | POA: Diagnosis not present

## 2023-05-11 DIAGNOSIS — H2513 Age-related nuclear cataract, bilateral: Secondary | ICD-10-CM | POA: Diagnosis not present

## 2023-05-20 DIAGNOSIS — Z0100 Encounter for examination of eyes and vision without abnormal findings: Secondary | ICD-10-CM | POA: Diagnosis not present

## 2023-06-08 DIAGNOSIS — Z01419 Encounter for gynecological examination (general) (routine) without abnormal findings: Secondary | ICD-10-CM | POA: Diagnosis not present

## 2023-06-08 DIAGNOSIS — Z01411 Encounter for gynecological examination (general) (routine) with abnormal findings: Secondary | ICD-10-CM | POA: Diagnosis not present

## 2023-06-08 DIAGNOSIS — Z8742 Personal history of other diseases of the female genital tract: Secondary | ICD-10-CM | POA: Diagnosis not present

## 2023-06-08 DIAGNOSIS — Z9189 Other specified personal risk factors, not elsewhere classified: Secondary | ICD-10-CM | POA: Diagnosis not present

## 2023-06-08 DIAGNOSIS — N891 Moderate vaginal dysplasia: Secondary | ICD-10-CM | POA: Diagnosis not present

## 2023-06-08 DIAGNOSIS — Z1331 Encounter for screening for depression: Secondary | ICD-10-CM | POA: Diagnosis not present

## 2023-06-21 DIAGNOSIS — Z23 Encounter for immunization: Secondary | ICD-10-CM | POA: Diagnosis not present

## 2023-06-29 DIAGNOSIS — M1712 Unilateral primary osteoarthritis, left knee: Secondary | ICD-10-CM | POA: Diagnosis not present

## 2023-07-05 DIAGNOSIS — Z01818 Encounter for other preprocedural examination: Secondary | ICD-10-CM | POA: Diagnosis not present

## 2023-07-05 DIAGNOSIS — D485 Neoplasm of uncertain behavior of skin: Secondary | ICD-10-CM | POA: Diagnosis not present

## 2023-07-05 DIAGNOSIS — H0279 Other degenerative disorders of eyelid and periocular area: Secondary | ICD-10-CM | POA: Diagnosis not present

## 2023-07-20 DIAGNOSIS — M1712 Unilateral primary osteoarthritis, left knee: Secondary | ICD-10-CM | POA: Diagnosis not present

## 2023-07-27 DIAGNOSIS — M1712 Unilateral primary osteoarthritis, left knee: Secondary | ICD-10-CM | POA: Diagnosis not present

## 2023-08-03 DIAGNOSIS — M1712 Unilateral primary osteoarthritis, left knee: Secondary | ICD-10-CM | POA: Diagnosis not present

## 2023-08-23 DIAGNOSIS — J4489 Other specified chronic obstructive pulmonary disease: Secondary | ICD-10-CM | POA: Diagnosis not present

## 2023-08-23 DIAGNOSIS — I700 Atherosclerosis of aorta: Secondary | ICD-10-CM | POA: Diagnosis not present

## 2023-08-23 DIAGNOSIS — E876 Hypokalemia: Secondary | ICD-10-CM | POA: Diagnosis not present

## 2023-08-23 DIAGNOSIS — I2510 Atherosclerotic heart disease of native coronary artery without angina pectoris: Secondary | ICD-10-CM | POA: Diagnosis not present

## 2023-08-31 DIAGNOSIS — Z0000 Encounter for general adult medical examination without abnormal findings: Secondary | ICD-10-CM | POA: Diagnosis not present

## 2023-08-31 DIAGNOSIS — Z1331 Encounter for screening for depression: Secondary | ICD-10-CM | POA: Diagnosis not present

## 2023-08-31 DIAGNOSIS — J4489 Other specified chronic obstructive pulmonary disease: Secondary | ICD-10-CM | POA: Diagnosis not present

## 2023-08-31 DIAGNOSIS — I2510 Atherosclerotic heart disease of native coronary artery without angina pectoris: Secondary | ICD-10-CM | POA: Diagnosis not present

## 2023-09-02 ENCOUNTER — Other Ambulatory Visit: Payer: Self-pay | Admitting: Obstetrics and Gynecology

## 2023-09-02 DIAGNOSIS — Z1231 Encounter for screening mammogram for malignant neoplasm of breast: Secondary | ICD-10-CM

## 2023-09-06 DIAGNOSIS — J301 Allergic rhinitis due to pollen: Secondary | ICD-10-CM | POA: Diagnosis not present

## 2023-09-06 DIAGNOSIS — J4520 Mild intermittent asthma, uncomplicated: Secondary | ICD-10-CM | POA: Diagnosis not present

## 2023-09-23 ENCOUNTER — Ambulatory Visit
Admission: RE | Admit: 2023-09-23 | Discharge: 2023-09-23 | Disposition: A | Discharge status: Home / Self Care | Payer: HMO | Source: Ambulatory Visit | Attending: Obstetrics and Gynecology | Admitting: Obstetrics and Gynecology

## 2023-09-23 DIAGNOSIS — Z1231 Encounter for screening mammogram for malignant neoplasm of breast: Secondary | ICD-10-CM | POA: Diagnosis not present

## 2023-11-01 ENCOUNTER — Other Ambulatory Visit (HOSPITAL_COMMUNITY): Payer: Self-pay

## 2023-11-15 ENCOUNTER — Other Ambulatory Visit (HOSPITAL_COMMUNITY): Payer: Self-pay

## 2023-11-15 MED ORDER — SPIRONOLACTONE 25 MG PO TABS
25.0000 mg | ORAL_TABLET | Freq: Every day | ORAL | 3 refills | Status: AC
  Filled 2023-11-15: qty 90, 90d supply, fill #0
  Filled 2024-02-14: qty 90, 90d supply, fill #1
  Filled 2024-05-08: qty 90, 90d supply, fill #2
  Filled 2024-08-03: qty 90, 90d supply, fill #3

## 2023-11-15 MED ORDER — ROSUVASTATIN CALCIUM 20 MG PO TABS
20.0000 mg | ORAL_TABLET | Freq: Every day | ORAL | 3 refills | Status: AC
  Filled 2023-11-15: qty 90, 90d supply, fill #0
  Filled 2024-02-14: qty 90, 90d supply, fill #1
  Filled 2024-05-08: qty 90, 90d supply, fill #2
  Filled 2024-08-03: qty 90, 90d supply, fill #3

## 2023-11-15 MED ORDER — MELOXICAM 15 MG PO TABS
15.0000 mg | ORAL_TABLET | Freq: Every day | ORAL | 3 refills | Status: DC
  Filled 2023-11-15: qty 90, 90d supply, fill #0
  Filled 2024-02-14: qty 90, 90d supply, fill #1

## 2023-11-19 DIAGNOSIS — M1712 Unilateral primary osteoarthritis, left knee: Secondary | ICD-10-CM | POA: Diagnosis not present

## 2023-12-13 ENCOUNTER — Other Ambulatory Visit (HOSPITAL_COMMUNITY): Payer: Self-pay

## 2023-12-13 MED ORDER — OMEPRAZOLE 40 MG PO CPDR
40.0000 mg | DELAYED_RELEASE_CAPSULE | Freq: Two times a day (BID) | ORAL | 3 refills | Status: AC
  Filled 2023-12-13: qty 180, 90d supply, fill #0
  Filled 2024-03-24: qty 180, 90d supply, fill #1
  Filled 2024-06-27: qty 180, 90d supply, fill #2
  Filled 2024-09-25: qty 180, 90d supply, fill #3

## 2023-12-13 MED ORDER — ARNUITY ELLIPTA 100 MCG/ACT IN AEPB
1.0000 | INHALATION_SPRAY | Freq: Every day | RESPIRATORY_TRACT | 6 refills | Status: AC
  Filled 2023-12-13: qty 30, 30d supply, fill #0
  Filled 2024-01-10: qty 30, 30d supply, fill #1

## 2023-12-14 ENCOUNTER — Other Ambulatory Visit (HOSPITAL_COMMUNITY): Payer: Self-pay

## 2023-12-14 ENCOUNTER — Other Ambulatory Visit: Payer: Self-pay

## 2023-12-14 MED ORDER — AZELASTINE HCL 0.1 % NA SOLN
NASAL | 3 refills | Status: DC
  Filled 2023-12-14: qty 30, 30d supply, fill #0
  Filled 2024-03-24: qty 30, 30d supply, fill #1
  Filled 2024-05-11: qty 30, 30d supply, fill #2
  Filled 2024-08-03: qty 30, 30d supply, fill #3

## 2023-12-14 MED ORDER — FLUTICASONE PROPIONATE 50 MCG/ACT NA SUSP
NASAL | 3 refills | Status: AC
  Filled 2023-12-14: qty 48, 90d supply, fill #0
  Filled 2024-04-24: qty 48, 90d supply, fill #1
  Filled 2024-08-29: qty 48, 90d supply, fill #2

## 2024-01-10 ENCOUNTER — Other Ambulatory Visit (HOSPITAL_COMMUNITY): Payer: Self-pay

## 2024-02-08 ENCOUNTER — Other Ambulatory Visit (HOSPITAL_COMMUNITY): Payer: Self-pay

## 2024-02-08 ENCOUNTER — Other Ambulatory Visit: Payer: Self-pay

## 2024-02-08 MED ORDER — ARNUITY ELLIPTA 100 MCG/ACT IN AEPB
1.0000 | INHALATION_SPRAY | Freq: Every day | RESPIRATORY_TRACT | 1 refills | Status: DC
  Filled 2024-02-08: qty 90, 90d supply, fill #0
  Filled 2024-05-11: qty 90, 90d supply, fill #1

## 2024-02-11 DIAGNOSIS — M1712 Unilateral primary osteoarthritis, left knee: Secondary | ICD-10-CM | POA: Diagnosis not present

## 2024-02-14 ENCOUNTER — Other Ambulatory Visit (HOSPITAL_COMMUNITY): Payer: Self-pay

## 2024-02-18 DIAGNOSIS — M1712 Unilateral primary osteoarthritis, left knee: Secondary | ICD-10-CM | POA: Diagnosis not present

## 2024-02-21 DIAGNOSIS — J4489 Other specified chronic obstructive pulmonary disease: Secondary | ICD-10-CM | POA: Diagnosis not present

## 2024-02-21 DIAGNOSIS — I2510 Atherosclerotic heart disease of native coronary artery without angina pectoris: Secondary | ICD-10-CM | POA: Diagnosis not present

## 2024-02-21 DIAGNOSIS — Z0000 Encounter for general adult medical examination without abnormal findings: Secondary | ICD-10-CM | POA: Diagnosis not present

## 2024-02-25 DIAGNOSIS — M1712 Unilateral primary osteoarthritis, left knee: Secondary | ICD-10-CM | POA: Diagnosis not present

## 2024-02-28 DIAGNOSIS — I700 Atherosclerosis of aorta: Secondary | ICD-10-CM | POA: Diagnosis not present

## 2024-02-28 DIAGNOSIS — I2510 Atherosclerotic heart disease of native coronary artery without angina pectoris: Secondary | ICD-10-CM | POA: Diagnosis not present

## 2024-02-28 DIAGNOSIS — E7800 Pure hypercholesterolemia, unspecified: Secondary | ICD-10-CM | POA: Diagnosis not present

## 2024-02-28 DIAGNOSIS — J4489 Other specified chronic obstructive pulmonary disease: Secondary | ICD-10-CM | POA: Diagnosis not present

## 2024-03-09 DIAGNOSIS — J4520 Mild intermittent asthma, uncomplicated: Secondary | ICD-10-CM | POA: Diagnosis not present

## 2024-03-09 DIAGNOSIS — J4489 Other specified chronic obstructive pulmonary disease: Secondary | ICD-10-CM | POA: Diagnosis not present

## 2024-03-09 DIAGNOSIS — Z1331 Encounter for screening for depression: Secondary | ICD-10-CM | POA: Diagnosis not present

## 2024-03-09 DIAGNOSIS — J301 Allergic rhinitis due to pollen: Secondary | ICD-10-CM | POA: Diagnosis not present

## 2024-03-10 DIAGNOSIS — L821 Other seborrheic keratosis: Secondary | ICD-10-CM | POA: Diagnosis not present

## 2024-03-10 DIAGNOSIS — D225 Melanocytic nevi of trunk: Secondary | ICD-10-CM | POA: Diagnosis not present

## 2024-03-10 DIAGNOSIS — D485 Neoplasm of uncertain behavior of skin: Secondary | ICD-10-CM | POA: Diagnosis not present

## 2024-03-10 DIAGNOSIS — D235 Other benign neoplasm of skin of trunk: Secondary | ICD-10-CM | POA: Diagnosis not present

## 2024-03-10 DIAGNOSIS — D2262 Melanocytic nevi of left upper limb, including shoulder: Secondary | ICD-10-CM | POA: Diagnosis not present

## 2024-03-10 DIAGNOSIS — D2272 Melanocytic nevi of left lower limb, including hip: Secondary | ICD-10-CM | POA: Diagnosis not present

## 2024-03-10 DIAGNOSIS — D2271 Melanocytic nevi of right lower limb, including hip: Secondary | ICD-10-CM | POA: Diagnosis not present

## 2024-03-10 DIAGNOSIS — D2261 Melanocytic nevi of right upper limb, including shoulder: Secondary | ICD-10-CM | POA: Diagnosis not present

## 2024-03-24 ENCOUNTER — Other Ambulatory Visit (HOSPITAL_COMMUNITY): Payer: Self-pay

## 2024-04-24 ENCOUNTER — Other Ambulatory Visit (HOSPITAL_COMMUNITY): Payer: Self-pay

## 2024-05-08 ENCOUNTER — Other Ambulatory Visit (HOSPITAL_COMMUNITY): Payer: Self-pay

## 2024-05-08 ENCOUNTER — Other Ambulatory Visit: Payer: Self-pay

## 2024-05-11 ENCOUNTER — Other Ambulatory Visit: Payer: Self-pay

## 2024-05-11 ENCOUNTER — Other Ambulatory Visit (HOSPITAL_COMMUNITY): Payer: Self-pay

## 2024-06-05 DIAGNOSIS — J4489 Other specified chronic obstructive pulmonary disease: Secondary | ICD-10-CM | POA: Diagnosis not present

## 2024-06-05 DIAGNOSIS — E7800 Pure hypercholesterolemia, unspecified: Secondary | ICD-10-CM | POA: Diagnosis not present

## 2024-06-05 DIAGNOSIS — Z23 Encounter for immunization: Secondary | ICD-10-CM | POA: Diagnosis not present

## 2024-06-05 DIAGNOSIS — Z0000 Encounter for general adult medical examination without abnormal findings: Secondary | ICD-10-CM | POA: Diagnosis not present

## 2024-06-05 DIAGNOSIS — I2510 Atherosclerotic heart disease of native coronary artery without angina pectoris: Secondary | ICD-10-CM | POA: Diagnosis not present

## 2024-06-05 DIAGNOSIS — R799 Abnormal finding of blood chemistry, unspecified: Secondary | ICD-10-CM | POA: Diagnosis not present

## 2024-06-05 DIAGNOSIS — Z1331 Encounter for screening for depression: Secondary | ICD-10-CM | POA: Diagnosis not present

## 2024-06-05 DIAGNOSIS — J4520 Mild intermittent asthma, uncomplicated: Secondary | ICD-10-CM | POA: Diagnosis not present

## 2024-06-06 DIAGNOSIS — M1712 Unilateral primary osteoarthritis, left knee: Secondary | ICD-10-CM | POA: Diagnosis not present

## 2024-06-27 ENCOUNTER — Other Ambulatory Visit (HOSPITAL_COMMUNITY): Payer: Self-pay

## 2024-08-03 ENCOUNTER — Other Ambulatory Visit: Payer: Self-pay

## 2024-08-03 ENCOUNTER — Other Ambulatory Visit (HOSPITAL_COMMUNITY): Payer: Self-pay

## 2024-08-03 MED ORDER — FLUTICASONE FUROATE 100 MCG/ACT IN AEPB
1.0000 | INHALATION_SPRAY | Freq: Every day | RESPIRATORY_TRACT | 1 refills | Status: AC
  Filled 2024-08-03: qty 90, 90d supply, fill #0

## 2024-08-04 ENCOUNTER — Other Ambulatory Visit: Payer: Self-pay

## 2024-08-29 ENCOUNTER — Other Ambulatory Visit: Payer: Self-pay

## 2024-09-04 ENCOUNTER — Other Ambulatory Visit: Payer: Self-pay | Admitting: Internal Medicine

## 2024-09-04 DIAGNOSIS — Z1231 Encounter for screening mammogram for malignant neoplasm of breast: Secondary | ICD-10-CM

## 2024-09-25 ENCOUNTER — Other Ambulatory Visit (HOSPITAL_COMMUNITY): Payer: Self-pay

## 2024-09-25 ENCOUNTER — Encounter

## 2024-10-03 ENCOUNTER — Other Ambulatory Visit: Payer: Self-pay

## 2024-10-03 ENCOUNTER — Other Ambulatory Visit (HOSPITAL_COMMUNITY): Payer: Self-pay

## 2024-10-03 MED ORDER — AZELASTINE HCL 0.1 % NA SOLN
1.0000 | Freq: Two times a day (BID) | NASAL | 3 refills | Status: AC
  Filled 2024-10-03: qty 30, 30d supply, fill #0

## 2024-10-04 ENCOUNTER — Ambulatory Visit
Admission: RE | Admit: 2024-10-04 | Discharge: 2024-10-04 | Disposition: A | Payer: Self-pay | Source: Ambulatory Visit | Attending: Internal Medicine | Admitting: Internal Medicine

## 2024-10-04 DIAGNOSIS — Z1231 Encounter for screening mammogram for malignant neoplasm of breast: Secondary | ICD-10-CM
